# Patient Record
Sex: Female | Born: 1960 | Race: White | Hispanic: No | Marital: Married
Health system: Southern US, Community
[De-identification: ages and names within clinical notes are randomized; demographics above are authoritative.]

---

## 2002-04-04 ENCOUNTER — Other Ambulatory Visit: Admission: RE | Admit: 2002-04-04 | Discharge: 2002-04-04 | Payer: Self-pay | Admitting: Obstetrics and Gynecology

## 2002-10-08 ENCOUNTER — Inpatient Hospital Stay (HOSPITAL_COMMUNITY): Admission: AD | Admit: 2002-10-08 | Discharge: 2002-10-11 | Payer: Self-pay | Admitting: Obstetrics and Gynecology

## 2002-10-14 ENCOUNTER — Inpatient Hospital Stay (HOSPITAL_COMMUNITY): Admission: AD | Admit: 2002-10-14 | Discharge: 2002-10-14 | Payer: Self-pay | Admitting: Obstetrics and Gynecology

## 2002-11-19 ENCOUNTER — Other Ambulatory Visit: Admission: RE | Admit: 2002-11-19 | Discharge: 2002-11-19 | Payer: Self-pay | Admitting: Obstetrics and Gynecology

## 2003-12-23 ENCOUNTER — Other Ambulatory Visit: Admission: RE | Admit: 2003-12-23 | Discharge: 2003-12-23 | Payer: Self-pay | Admitting: Obstetrics and Gynecology

## 2004-12-30 ENCOUNTER — Encounter: Admission: RE | Admit: 2004-12-30 | Discharge: 2004-12-30 | Payer: Self-pay | Admitting: Gastroenterology

## 2005-02-21 ENCOUNTER — Ambulatory Visit: Payer: Self-pay | Admitting: Hematology and Oncology

## 2005-04-28 ENCOUNTER — Ambulatory Visit: Payer: Self-pay | Admitting: Hematology and Oncology

## 2013-09-11 ENCOUNTER — Other Ambulatory Visit: Payer: Self-pay | Admitting: Otolaryngology

## 2013-09-11 ENCOUNTER — Ambulatory Visit
Admission: RE | Admit: 2013-09-11 | Discharge: 2013-09-11 | Disposition: A | Discharge status: Home / Self Care | Payer: BC Managed Care – PPO | Source: Ambulatory Visit | Attending: Otolaryngology | Admitting: Otolaryngology

## 2013-09-11 DIAGNOSIS — J40 Bronchitis, not specified as acute or chronic: Secondary | ICD-10-CM

## 2016-11-24 ENCOUNTER — Ambulatory Visit (INDEPENDENT_AMBULATORY_CARE_PROVIDER_SITE_OTHER): Payer: 59

## 2016-11-24 ENCOUNTER — Encounter: Payer: Self-pay | Admitting: Emergency Medicine

## 2016-11-24 ENCOUNTER — Ambulatory Visit (HOSPITAL_COMMUNITY)
Admission: EM | Admit: 2016-11-24 | Discharge: 2016-11-24 | Disposition: A | Discharge status: Home / Self Care | Payer: 59 | Attending: Internal Medicine | Admitting: Internal Medicine

## 2016-11-24 DIAGNOSIS — J40 Bronchitis, not specified as acute or chronic: Secondary | ICD-10-CM | POA: Diagnosis not present

## 2016-11-24 DIAGNOSIS — R0789 Other chest pain: Secondary | ICD-10-CM

## 2016-11-24 DIAGNOSIS — K219 Gastro-esophageal reflux disease without esophagitis: Secondary | ICD-10-CM | POA: Diagnosis not present

## 2016-11-24 MED ORDER — PREDNISONE 20 MG PO TABS: 20.0000 mg | ORAL_TABLET | Freq: Two times a day (BID) | ORAL | 0 refills | Status: AC

## 2016-11-24 MED ORDER — BENZONATATE 100 MG PO CAPS: 100.0000 mg | ORAL_CAPSULE | Freq: Three times a day (TID) | ORAL | 0 refills | Status: AC

## 2016-11-24 NOTE — ED Provider Notes (Signed)
CSN: [id number]     Arrival date & time 11/24/16  1437 History   First MD Initiated Contact with Patient 11/24/16 1523     Chief Complaint  Patient presents with  . Shortness of Breath  . Chest Pain   (Consider location/radiation/quality/duration/timing/severity/associated sxs/prior Treatment) 56 year old female presents to clinic with one week history of chest tightness on the left side as well as cough. She was prescribed levofloxacin by her primary care provider via telephone consult 4 days ago, has not had any relief of her symptoms. She describes describes her symptoms as being a left-sided chest pressure. She reports that she had this sensation twice over the last week. She states she is not currently experiencing any chest pain at this time but she is still continuing to have cough and respiratory symptoms. She also describes reflux like symptoms worse after eating, and a sensation of fullness in the back of her throat, along with a cough. Her pain is not worsened with exertion, she has no shortness of breath, she has no sensation of heart palpitations, she has had no swelling in her hands, feet, or ankles. She has past medical history of hypertension, and is treated with Bystolic. She does not smoke however does have prior history of smoking for one pack per day from the age of 28 through 25, she also reports living with a smoker.   The history is provided by the patient.  Shortness of Breath  Associated symptoms: chest pain   Chest Pain  Associated symptoms: shortness of breath     No past medical history on file. No past surgical history on file. No family history on file. Social History  Substance Use Topics  . Smoking status: Not on file  . Smokeless tobacco: Not on file  . Alcohol use Not on file   OB History    No data available     Review of Systems  Reason unable to perform ROS: as covered in HPI.  Respiratory: Positive for shortness of breath.   Cardiovascular:  Positive for chest pain.  All other systems reviewed and are negative.   Allergies  Patient has no known allergies.  Home Medications   Prior to Admission medications   Not on File   Meds Ordered and Administered this Visit  Medications - No data to display  BP 138/93 (BP Location: Right Arm)   Pulse 82   Temp 98.8 F (37.1 C) (Oral)   Resp 16   SpO2 96%  No data found.   Physical Exam  Constitutional: She is oriented to person, place, and time. She appears well-developed and well-nourished. No distress.  HENT:  Head: Normocephalic and atraumatic.  Right Ear: External ear normal.  Left Ear: External ear normal.  Mouth/Throat: Oropharynx is clear and moist.  Eyes: Pupils are equal, round, and reactive to light.  Neck: Normal range of motion. Neck supple. No JVD present.  Cardiovascular: Normal rate, regular rhythm, normal heart sounds, intact distal pulses and normal pulses.   Pulmonary/Chest: Effort normal. No respiratory distress. She has no wheezes. She has rhonchi in the right middle field and the left middle field.  Abdominal: Soft. Bowel sounds are normal.  Lymphadenopathy:    She has no cervical adenopathy.  Neurological: She is alert and oriented to person, place, and time.  Skin: Skin is warm and dry. Capillary refill takes less than 2 seconds. No rash noted. She is not diaphoretic. No pallor.  Psychiatric: She has a normal mood and affect.  Nursing note and vitals reviewed.   Urgent Care Course     ED EKG Date/Time: 11/24/2016 4:19 PM Performed by: Dorena BodoKENNARD, Adelyna Brockman Authorized by: Dorena BodoKENNARD, Talley Kreiser   ECG reviewed by ED Physician in the absence of a cardiologist: no   Previous ECG:    Previous ECG:  Unavailable Interpretation:    Interpretation: normal   Rate:    ECG rate:  76   ECG rate assessment: normal   Rhythm:    Rhythm: sinus rhythm   Ectopy:    Ectopy: none   QRS:    QRS axis:  Normal Conduction:    Conduction: normal   ST segments:     ST segments:  Normal T waves:    T waves: normal   Comments:     Ventricular rate: 76 PR Interval 138 ms QRS Duration 86 ms QT/QTc 378/425 ms   (including critical care time)  Labs Review Labs Reviewed - No data to display  Imaging Review Dg Chest 2 View  Result Date: 11/24/2016 CLINICAL DATA:  Chest pain. EXAM: CHEST  2 VIEW COMPARISON:  Radiographs of September 11, 2013. FINDINGS: The heart size and mediastinal contours are within normal limits. Both lungs are clear. No pneumothorax or pleural effusion is noted. The visualized skeletal structures are unremarkable. IMPRESSION: No active cardiopulmonary disease. Electronically Signed   By: Lupita RaiderJames  Green Jr, M.D.   On: 11/24/2016 15:49     Visual Acuity Review  Right Eye Distance:   Left Eye Distance:   Bilateral Distance:    Right Eye Near:   Left Eye Near:    Bilateral Near:         MDM   1. Bronchitis   2. Gastric reflux    Your chest x-ray showed no acute findings such as active cardiopulmonary disease or pneumonia. Your EKG showed a normal sinus rhythm and was otherwise unremarkable. He most likely have bronchitis. I'd recommend continuing the antibiotic that your primary care provider started for you. I have also written for a cough medicine called Tessalon take one tablet every 8 hours as needed. I have also written for steroid called prednisone. Take one tablet twice a day with food. Should your symptoms fail to resolve, I would recommend you follow up with your primary care provider.  Pt declined treatment for reflux.      Dorena BodoLawrence Jasmin Trumbull, NP 11/24/16 1622

## 2016-11-24 NOTE — Discharge Instructions (Signed)
Your chest x-ray showed no acute findings such as active cardiopulmonary disease or pneumonia. Your EKG showed a normal sinus rhythm and was otherwise unremarkable. He most likely have bronchitis. I'd recommend continuing the antibiotic that your primary care provider started for you. I have also written for a cough medicine called Tessalon take one tablet every 8 hours as needed. I have also written for steroid called prednisone. Take one tablet twice a day with food. Should your symptoms fail to resolve, I would recommend you follow up with your primary care provider.

## 2016-11-24 NOTE — ED Triage Notes (Signed)
C/o cough and chest tightness otc meds and antibiotic used as tx for four days

## 2018-06-25 IMAGING — DX DG CHEST 2V
2 series · 2 of 2 positions shown · non-contrast
Comparison: Radiographs September 11, 2013.

CLINICAL DATA: Chest pain.

EXAM:
CHEST  2 VIEW

[chest pa]
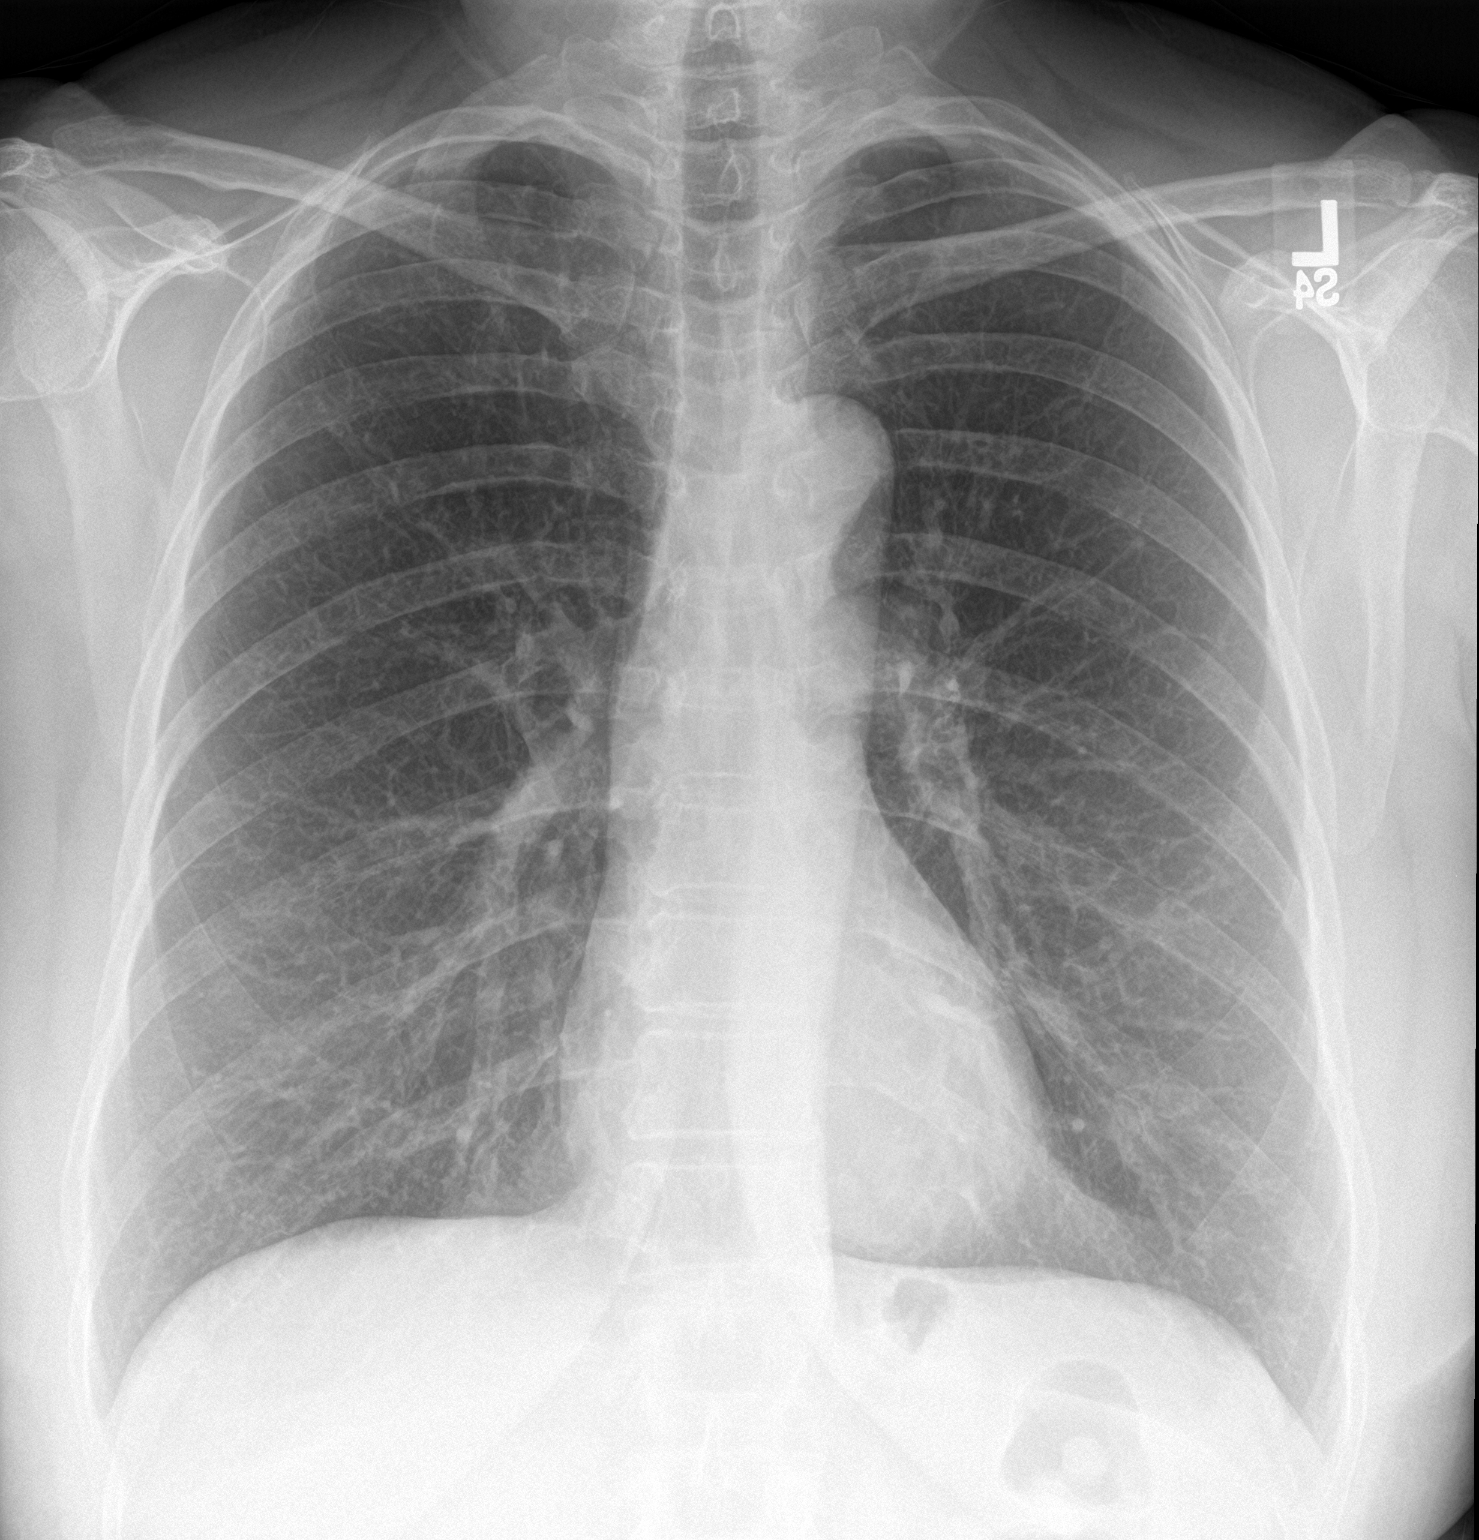

[chest lat]
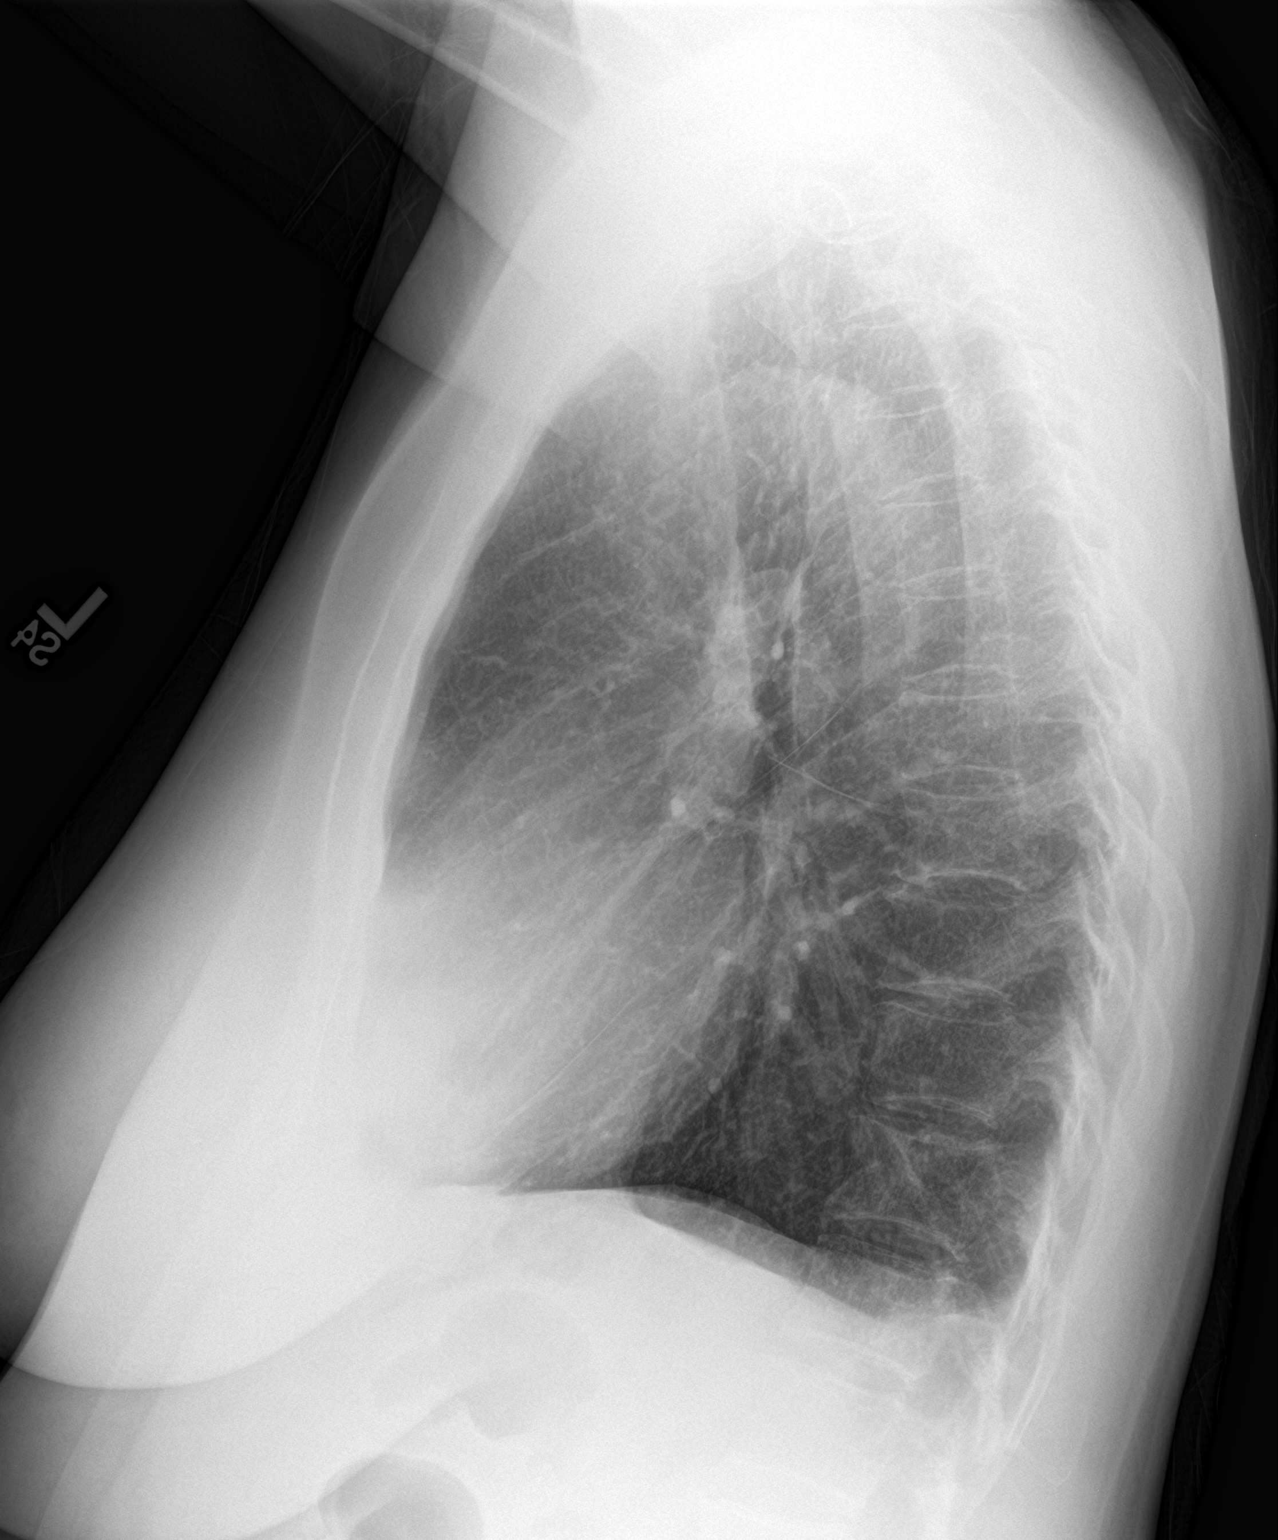

[2 of 2 positions shown; findings below may reference images not displayed]

FINDINGS: The heart size and mediastinal contours are within normal limits.
Both lungs are clear. No pneumothorax or pleural effusion is noted.
The visualized skeletal structures are unremarkable.
IMPRESSION: No active cardiopulmonary disease.
